# Patient Record
Sex: Male | Born: 2000
Health system: Southern US, Community
[De-identification: ages and names within clinical notes are randomized; demographics above are authoritative.]

---
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
## 2018-11-13 ENCOUNTER — Emergency Department
Admission: EM | Admit: 2018-11-13 | Discharge: 2018-11-13 | Disposition: A | Discharge status: Home / Self Care | Payer: BC Managed Care – PPO | Attending: Student | Admitting: Student

## 2018-11-13 ENCOUNTER — Emergency Department: Payer: BC Managed Care – PPO

## 2018-11-13 ENCOUNTER — Encounter: Payer: Self-pay | Admitting: Intensive Care

## 2018-11-13 ENCOUNTER — Other Ambulatory Visit: Payer: Self-pay

## 2018-11-13 DIAGNOSIS — R55 Syncope and collapse: Secondary | ICD-10-CM | POA: Insufficient documentation

## 2018-11-13 DIAGNOSIS — R000 Tachycardia, unspecified: Secondary | ICD-10-CM | POA: Insufficient documentation

## 2018-11-13 LAB — CBC
HCT: 51.6 % (ref 39.0–52.0)
Hemoglobin: 17.8 g/dL — ABNORMAL HIGH (ref 13.0–17.0)
MCH: 29.3 pg (ref 26.0–34.0)
MCHC: 34.5 g/dL (ref 30.0–36.0)
MCV: 84.9 fL (ref 80.0–100.0)
Platelets: 239 10*3/uL (ref 150–400)
RBC: 6.08 MIL/uL — ABNORMAL HIGH (ref 4.22–5.81)
RDW: 12.5 % (ref 11.5–15.5)
WBC: 7.4 10*3/uL (ref 4.0–10.5)
nRBC: 0 % (ref 0.0–0.2)

## 2018-11-13 LAB — BASIC METABOLIC PANEL
Anion gap: 10 (ref 5–15)
BUN: 15 mg/dL (ref 6–20)
CO2: 25 mmol/L (ref 22–32)
Calcium: 9.3 mg/dL (ref 8.9–10.3)
Chloride: 107 mmol/L (ref 98–111)
Creatinine, Ser: 1 mg/dL (ref 0.61–1.24)
GFR calc Af Amer: >60 mL/min (ref >60)
GFR calc non Af Amer: >60 mL/min (ref >60)
Glucose, Bld: 105 mg/dL — ABNORMAL HIGH (ref 70–99)
Potassium: 4.5 mmol/L (ref 3.5–5.1)
Sodium: 142 mmol/L (ref 135–145)

## 2018-11-13 LAB — URINALYSIS, COMPLETE (UACMP) WITH MICROSCOPIC
Bilirubin Urine: NEGATIVE
Glucose, UA: NEGATIVE mg/dL
Hgb urine dipstick: NEGATIVE
Ketones, ur: 20 mg/dL — AB
Leukocytes,Ua: NEGATIVE
Nitrite: NEGATIVE
Protein, ur: NEGATIVE mg/dL
Specific Gravity, Urine: 1.017 (ref 1.005–1.030)
Squamous Epithelial / HPF: NONE SEEN (ref 0–5)
pH: 5 (ref 5.0–8.0)

## 2018-11-13 LAB — FIBRIN DERIVATIVES D-DIMER (ARMC ONLY): Fibrin derivatives D-dimer (ARMC): 94.16 ng/mL (FEU) (ref 0.00–499.00)

## 2018-11-13 LAB — URINE DRUG SCREEN, QUALITATIVE (ARMC ONLY)
Amphetamines, Ur Screen: NOT DETECTED
Barbiturates, Ur Screen: NOT DETECTED
Benzodiazepine, Ur Scrn: NOT DETECTED
Cannabinoid 50 Ng, Ur ~~LOC~~: NOT DETECTED
Cocaine Metabolite,Ur ~~LOC~~: NOT DETECTED
MDMA (Ecstasy)Ur Screen: NOT DETECTED
Methadone Scn, Ur: NOT DETECTED
Opiate, Ur Screen: NOT DETECTED
Phencyclidine (PCP) Ur S: NOT DETECTED
Tricyclic, Ur Screen: NOT DETECTED

## 2018-11-13 LAB — TSH: TSH: 3.596 u[IU]/mL (ref 0.350–4.500)

## 2018-11-13 LAB — TROPONIN I (HIGH SENSITIVITY)
Troponin I (High Sensitivity): 5 ng/L (ref <18)
Troponin I (High Sensitivity): 5 ng/L (ref <18)

## 2018-11-13 MED ORDER — SODIUM CHLORIDE 0.9 % IV BOLUS
1000.0000 mL | Freq: Once | INTRAVENOUS | Status: AC
  Administered 2018-11-13: 1000 mL via INTRAVENOUS

## 2018-11-13 NOTE — ED Provider Notes (Signed)
[facility] Emergency Department Provider Note  ____________________________________________   First MD Initiated Contact with Patient 11/13/18 1409     (approximate)  I have reviewed the triage vital signs and the nursing notes.  History  Chief Complaint Tachycardia and Near Syncope    HPI Shawn Gill is a 18 y.o. male with no significant medical history who presents to the emergency department for tachycardia and an episode of near syncope.  Patient states he was at work earlier today, he bent over to pick something up and felt like his heart started racing.  This was associated with some lightheadedness.  Patient felt as though he might pass out, however he denies any true loss of consciousness.  He feels like his heart was racing for about 1-1.5 hours, and improved once he was in the triage area here.  He denies any associated chest pain, shortness of breath, nausea, diaphoresis.  He denies any personal history of arrhythmias.  No history of VTE.  He denies any recent illnesses, no fevers or chills.  No nausea, vomiting, diarrhea.  He denies any alcohol or drug use.   Past Medical Hx History reviewed. No pertinent past medical history.  Problem List There are no active problems to display for this patient.   Past Surgical Hx History reviewed. No pertinent surgical history.  Medications Prior to Admission medications   Not on File    Allergies Patient has no known allergies.  Family Hx History reviewed. No pertinent family history.  Social Hx Social History   Tobacco Use  . Smoking status: Never Smoker  . Smokeless tobacco: Never Used  Substance Use Topics  . Alcohol use: Never    Frequency: Never  . Drug use: Never     Review of Systems  Constitutional: Negative for fever, chills. Eyes: Negative for visual changes. ENT: Negative for sore throat. Cardiovascular: + tachycardia, palpitations Respiratory: Negative for shortness  of breath. Gastrointestinal: Negative for nausea, vomiting.  Genitourinary: Negative for dysuria. Musculoskeletal: Negative for leg swelling. Skin: Negative for rash. Neurological: Negative for for headaches. + lightheaded   Physical Exam  Vital Signs: ED Triage Vitals [11/13/18 1140]  Enc Vitals Group     BP (!) 156/114     Pulse Rate (!) 114     Resp 18     Temp 99 F (37.2 C)     Temp Source Oral     SpO2 99 %     Weight 240 lb (108.9 kg)     Height 5\' 9"  (1.753 m)     Head Circumference      Peak Flow      Pain Score 0     Pain Loc      Pain Edu?      Excl. in [place]?     Constitutional: Alert and oriented.  Head: Normocephalic. Atraumatic. Eyes: Conjunctivae clear. Sclera anicteric. Nose: No congestion. No rhinorrhea. Mouth/Throat: Mucous membranes are dry.  Neck: No stridor.   Cardiovascular: Tachycardic. Extremities well perfused. Respiratory: Normal respiratory effort.  Lungs CTAB. Gastrointestinal: Soft. Non-tender. Non-distended.  Musculoskeletal: No lower extremity edema. No deformities. Neurologic:  Normal speech and language. No gross focal neurologic deficits are appreciated.  Skin: Skin is warm, dry and intact. No rash noted. Psychiatric: Mood and affect are appropriate for situation.  EKG  Personally reviewed.   Rate: 124 Rhythm: sinus Axis: normal Intervals: WNL TWI in III Sinus tachycardia, no evidence of Brugada, WPW, or prolonged QTc No STEMI  Radiology  XR: IMPRESSION:  Negative chest.    Procedures  Procedure(s) performed (including critical care):  Procedures   Initial Impression / Assessment and Plan / ED Course  18 y.o. male who presents to the ED for tachycardia and near syncope.  Ddx: dehydration, electrolyte abnormality, thyroid disease, substance use, VTE, arrhythmia.  Plan for labs, urine, EKG, imaging.  EKG reveals sinus tachycardia, no acute ischemic changes, no evidence of arrhythmia.  Lab work-up reveals  normal TSH, troponin x2  negative.  D-dimer within normal limits.  UDS negative.  Urine with small amount of ketones, consistent with dehydration.  Patient has received 2 L IVF and reports feeling improved.  Repeat vitals demonstrate improvement in his HR.  As such, will plan for discharge with outpatient follow-up.  Given information for referral to establish wtih PCP as well as cardiology to discuss possible Holter monitor, though feel dehydration is largely contributing. Discussed return precautions.   Final Clinical Impression(s) / ED Diagnosis  Final diagnoses:  Tachycardia  Near syncope       Note:  This document was prepared using Dragon voice recognition software and may include unintentional dictation errors.   Miguel Aschoff., MD 11/13/18 [id number]

## 2018-11-13 NOTE — ED Triage Notes (Signed)
Patient reports he was at work and started to feel his heart racing and felt near syncope. Reports he felt his heart racing around 1 1/2 hours. Denies LOC. Patient denies pain at this time.

## 2018-11-13 NOTE — Discharge Instructions (Signed)
Thank you for letting us take care of you in the emergency department today.   Please continue to take any regular, prescribed medications. Please be sure to stay well hydrated.   Please follow up with: - Primary care doctor to review your ER visit and follow up on your symptoms. Information for one is below.   Please return to the ER for any new or worsening symptoms.

## 2019-08-31 ENCOUNTER — Ambulatory Visit: Admit: 2019-08-31 | Discharge status: Absent without leave | Payer: Self-pay

## 2019-08-31 ENCOUNTER — Other Ambulatory Visit: Payer: Self-pay

## 2019-08-31 ENCOUNTER — Ambulatory Visit
Admission: EM | Admit: 2019-08-31 | Discharge: 2019-08-31 | Disposition: A | Discharge status: Home / Self Care | Payer: BC Managed Care – PPO | Attending: Physician Assistant | Admitting: Physician Assistant

## 2019-08-31 ENCOUNTER — Encounter: Payer: Self-pay | Admitting: Emergency Medicine

## 2019-08-31 DIAGNOSIS — I10 Essential (primary) hypertension: Secondary | ICD-10-CM | POA: Diagnosis present

## 2019-08-31 DIAGNOSIS — H6122 Impacted cerumen, left ear: Secondary | ICD-10-CM

## 2019-08-31 DIAGNOSIS — R42 Dizziness and giddiness: Secondary | ICD-10-CM

## 2019-08-31 LAB — URINALYSIS, COMPLETE (UACMP) WITH MICROSCOPIC
Bacteria, UA: NONE SEEN
Bilirubin Urine: NEGATIVE
Glucose, UA: NEGATIVE mg/dL
Hgb urine dipstick: NEGATIVE
Ketones, ur: NEGATIVE mg/dL
Leukocytes,Ua: NEGATIVE
Nitrite: NEGATIVE
Protein, ur: NEGATIVE mg/dL
Specific Gravity, Urine: 1.025 (ref 1.005–1.030)
pH: 6.5 (ref 5.0–8.0)

## 2019-08-31 NOTE — ED Provider Notes (Signed)
[facility]    CSN: [id number] Arrival date & time: 08/31/19  1205      History   Chief Complaint Chief Complaint  Patient presents with  . Dizziness  . Nausea    HPI Shawn Gill is a 19 y.o. male.   19 year old male presents for concerns about feeling dizzy and nauseous over the past 2 to 3 days.  He says he usually feels a little dizzy and nauseous when getting up in the morning and gets little better throughout the day.  Dizziness increases certain movements of his head.  He denies dizziness upon standing.  He denies sensation of the room spinning.  He denies chest pain, headaches, blurred vision, weakness, fatigue, body aches, shortness of breath, palpitations, vomiting.  He has no history of cardiopulmonary disease or diabetes.  Patient says he is concerned about his blood pressure being high.  He has checked his blood pressure when he has felt dizzy and says the readings have been in the 140s over upper 90s.  No history of high blood pressure and he does not take any medications currently.  He and his parents are most concerned about his blood pressure and how that could be related to him feeling dizzy.  Denies recent illness or COVID exposure. States he is regularly tested for COVID and has been negative. No other concerns     History reviewed. No pertinent past medical history.  There are no problems to display for this patient.   History reviewed. No pertinent surgical history.     Home Medications    Prior to Admission medications   Not on File    Family History History reviewed. No pertinent family history.  Social History Social History   Tobacco Use  . Smoking status: Never Smoker  . Smokeless tobacco: Never Used  Vaping Use  . Vaping Use: Never used  Substance Use Topics  . Alcohol use: Never  . Drug use: Never     Allergies   Patient has no known allergies.   Review of Systems Review of Systems  Constitutional: Negative  for diaphoresis, fatigue and fever.  HENT: Negative for congestion, ear pain, rhinorrhea, sinus pain and sore throat.   Respiratory: Negative for cough, chest tightness, shortness of breath and wheezing.   Cardiovascular: Negative for chest pain and palpitations.  Gastrointestinal: Negative for abdominal pain, nausea and vomiting.  Musculoskeletal: Negative for myalgias.  Neurological: Positive for dizziness and light-headedness. Negative for weakness, numbness and headaches.  Psychiatric/Behavioral: The patient is not nervous/anxious.      Physical Exam Triage Vital Signs ED Triage Vitals  Enc Vitals Group     BP 08/31/19 1247 (!) 145/102     Pulse Rate 08/31/19 1247 78     Resp 08/31/19 1247 16     Temp 08/31/19 1247 99 F (37.2 C)     Temp Source 08/31/19 1247 Oral     SpO2 08/31/19 1247 100 %     Weight 08/31/19 1245 243 lb (110.2 kg)     Height 08/31/19 1245 5\' 10"  (1.778 m)     Head Circumference --      Peak Flow --      Pain Score 08/31/19 1244 0     Pain Loc --      Pain Edu? --      Excl. in GC? --    No data found.  Updated Vital Signs BP (!) 146/92 (BP Location: Left Arm)   Pulse 78  Temp 99 F (37.2 C) (Oral)   Resp 16   Ht 5\' 10"  (1.778 m)   Wt 243 lb (110.2 kg)   SpO2 100%   BMI 34.87 kg/m       Physical Exam Vitals and nursing note reviewed.  Constitutional:      General: He is not in acute distress.    Appearance: Normal appearance. He is well-developed. He is obese. He is not ill-appearing or toxic-appearing.  HENT:     Head: Normocephalic and atraumatic.     Right Ear: Hearing normal. Tympanic membrane is injected.     Left Ear: Hearing normal. There is impacted cerumen. Tympanic membrane is injected.     Ears:     Comments: There is mild to moderate cerumen of the right EAC, not obstructing view of the TM.  After lavage, both TMs are mildly injected.    Nose: Nose normal. No rhinorrhea.     Mouth/Throat:     Mouth: Mucous membranes are  moist.     Pharynx: Oropharynx is clear.  Eyes:     General: No scleral icterus.    Extraocular Movements: Extraocular movements intact.     Conjunctiva/sclera: Conjunctivae normal.     Pupils: Pupils are equal, round, and reactive to light.  Cardiovascular:     Rate and Rhythm: Normal rate and regular rhythm.     Heart sounds: Normal heart sounds. No murmur heard.   Pulmonary:     Effort: Pulmonary effort is normal. No respiratory distress.     Breath sounds: Normal breath sounds.  Musculoskeletal:     Cervical back: Normal range of motion and neck supple.     Right lower leg: No edema.     Left lower leg: No edema.  Skin:    General: Skin is warm and dry.     Findings: No rash.  Neurological:     General: No focal deficit present.     Mental Status: He is alert and oriented to person, place, and time. Mental status is at baseline.     Motor: No weakness.     Coordination: Coordination normal.     Gait: Gait normal.  Psychiatric:        Mood and Affect: Mood normal.        Behavior: Behavior normal.        Thought Content: Thought content normal.      UC Treatments / Results  Labs (all labs ordered are listed, but only abnormal results are displayed) Labs Reviewed  URINALYSIS, COMPLETE (UACMP) WITH MICROSCOPIC    EKG   Radiology No results found.  Procedures Procedures (including critical care time)  Medications Ordered in UC Medications - No data to display  Initial Impression / Assessment and Plan / UC Course  I have reviewed the triage vital signs and the nursing notes.  Pertinent labs & imaging results that were available during my care of the patient were reviewed by me and considered in my medical decision making (see chart for details).  Clinical Course as of Aug 31 1439  Fri Aug 31, 2019  1340 BP(!): 145/102 [LE]    Clinical Course User Index [LE] Sep 02, 2019     19 year old male presenting for complaints of feeling dizzy,  lightheaded, nauseous intermittently.  Symptoms worsen with movement of his head.  On exam I noted cerumen impacted in the left EAC and moderate cerumen in the right EAC.  Lavage performed and he stated he was feeling  a little better.  Blood pressure elevated at 145/102.  Repeat blood pressure at 146/92.  Denies chest pain, shortness of breath, sweats, passing out.  UA performed today and normal.  Advised patient that his dizziness and lightheadedness is most likely related to the cerumen impaction and caused by issue with his equilibrium due to that.  Blood pressure is only mildly elevated and I do not suspect that would cause his dizziness.  I still advise him to follow-up with primary care and provided him with the contact information for primary care.  Advised him to follow-up with the ED if any symptoms worsen or he is any new symptoms such as chest pain, shortness of breath, weakness, passing out.   Final Clinical Impressions(s) / UC Diagnoses   Final diagnoses:  Dizziness  Elevated blood pressure reading in office with diagnosis of hypertension  Impacted cerumen of left ear     Discharge Instructions     Your blood pressure is mildly elevated in the clinic today. A blood pressure recheck was also mildly elevated today. On exam you had impacted earwax in the left ear canal. That was flushed out today. I believe your dizziness symptoms are probably related to the earwax. Hopefully, your dizziness resolves now the ear wax is cleaned out. However your blood pressure was little elevated so you should follow-up with primary care to have a work-up. At this time keep a log of your blood pressure daily. Also try to increase exercise, reduce stress, decrease calories and lose weight if overweight. You should seek immediate reexamination if you have chest pain, shortness of breath, severe headache, vision changes, or weakness.  [facility] at [facility], [address], [address].  Phone number: [phone] Please call this number to establish with primary care     ED Prescriptions    None     PDMP not reviewed this encounter.   Shirlee Latch, PA-C 08/31/19 1441

## 2019-08-31 NOTE — ED Triage Notes (Signed)
Patient c/o dizziness and nausea for the past 2-3 days.  Patient denies V/D.  Patient denies fevers.

## 2019-08-31 NOTE — Discharge Instructions (Signed)
Your blood pressure is mildly elevated in the clinic today. A blood pressure recheck was also mildly elevated today. On exam you had impacted earwax in the left ear canal. That was flushed out today. I believe your dizziness symptoms are probably related to the earwax. Hopefully, your dizziness resolves now the ear wax is cleaned out. However your blood pressure was little elevated so you should follow-up with primary care to have a work-up. At this time keep a log of your blood pressure daily. Also try to increase exercise, reduce stress, decrease calories and lose weight if overweight. You should seek immediate reexamination if you have chest pain, shortness of breath, severe headache, vision changes, or weakness.  [facility] at [facility], [address], [address]. Phone number: [phone] Please call this number to establish with primary care

## 2019-10-01 ENCOUNTER — Ambulatory Visit
Admission: EM | Admit: 2019-10-01 | Discharge: 2019-10-01 | Disposition: A | Discharge status: Other Acute Inpatient Hospital | Payer: BC Managed Care – PPO | Attending: Physician Assistant | Admitting: Physician Assistant

## 2019-10-01 ENCOUNTER — Emergency Department
Admission: EM | Admit: 2019-10-01 | Discharge: 2019-10-01 | Disposition: A | Discharge status: Home / Self Care | Payer: BC Managed Care – PPO | Attending: Emergency Medicine | Admitting: Emergency Medicine

## 2019-10-01 ENCOUNTER — Other Ambulatory Visit: Payer: Self-pay

## 2019-10-01 ENCOUNTER — Encounter: Payer: Self-pay | Admitting: Emergency Medicine

## 2019-10-01 DIAGNOSIS — I471 Supraventricular tachycardia, unspecified: Secondary | ICD-10-CM

## 2019-10-01 DIAGNOSIS — R079 Chest pain, unspecified: Secondary | ICD-10-CM | POA: Diagnosis not present

## 2019-10-01 DIAGNOSIS — R002 Palpitations: Secondary | ICD-10-CM | POA: Diagnosis present

## 2019-10-01 MED ORDER — ADENOSINE 12 MG/4ML IV SOLN: 12.0000 mg | Freq: Once | INTRAVENOUS | Status: AC

## 2019-10-01 MED ORDER — DILTIAZEM HCL 25 MG/5ML IV SOLN
10.0000 mg | Freq: Once | INTRAVENOUS | Status: AC
  Administered 2019-10-01: 10 mg via INTRAVENOUS

## 2019-10-01 MED ORDER — ADENOSINE 12 MG/4ML IV SOLN
INTRAVENOUS | Status: AC
  Administered 2019-10-01: 12 mg via INTRAVENOUS
  Filled 2019-10-01: qty 4

## 2019-10-01 MED ORDER — SODIUM CHLORIDE 0.9 % IV BOLUS
1000.0000 mL | Freq: Once | INTRAVENOUS | Status: AC
  Administered 2019-10-01: 1000 mL via INTRAVENOUS

## 2019-10-01 NOTE — ED Notes (Signed)
Patient is being discharged from the Urgent Care and sent to the Emergency Department via EMS . Per Eusebio Friendly, PA, patient is in need of higher level of care due to SVT. Patient is aware and verbalizes understanding of plan of care.  Vitals:   10/01/19 1732 10/01/19 1751  BP: 111/82 (!) 111/50  Pulse: (!) 224 (!) 111  Resp: (!) 22   SpO2: 98% 98%

## 2019-10-01 NOTE — ED Provider Notes (Signed)
[facility]    CSN: [id number] Arrival date & time: 10/01/19  1644      History   Chief Complaint Chief Complaint  Patient presents with  . Palpitations    HPI Shawn Gill is a 19 y.o. male.   Patient presents for feeling dizzy and having palpitations for the past hour.  He says symptoms are progressively worsening.  Admits to some numbness and tingling in his hands.  He says he has felt dizzy and like his heart was fluttering occasionally at nighttime, but never anything like this.  Patient denies any history of cardiopulmonary disease.  He currently admits to some chest pain and feeling tired.  He denies any fevers, recent illness, cough.  He admits to some mild shortness of breath.  Denies headaches.  Denies vision changes.  Denies syncope.  Has not received Covid vaccine.  Denies Covid exposure.  Patient denies taking any new medications and says he does not take any routine medications.  He denies smoking, alcohol use, or drug use.  He says he was just at work when this came on and nothing seemed to start it.  Patient was seen in the clinic a month ago for dizziness, but denied any palpitations or chest pain at that time.  His dizziness at that time was worsened with moving his head.  Otic lavage performed at that time which relieved his dizziness.  He says that his current symptoms are not similar to what he was experiencing last month.  There are no other complaints or concerns.  HPI  History reviewed. No pertinent past medical history.  There are no problems to display for this patient.   History reviewed. No pertinent surgical history.     Home Medications    Prior to Admission medications   Not on File    Family History Family History  Problem Relation Age of Onset  . Healthy Mother   . Healthy Father     Social History Social History   Tobacco Use  . Smoking status: Never Smoker  . Smokeless tobacco: Never Used  Vaping Use  . Vaping Use:  Never used  Substance Use Topics  . Alcohol use: Never  . Drug use: Never     Allergies   Patient has no known allergies.   Review of Systems Review of Systems  Constitutional: Negative for fatigue and fever.  HENT: Negative for congestion.   Eyes: Negative for visual disturbance.  Respiratory: Positive for shortness of breath. Negative for cough and wheezing.   Cardiovascular: Positive for chest pain and palpitations.  Gastrointestinal: Negative for abdominal pain, nausea and vomiting.  Musculoskeletal: Negative for myalgias.  Neurological: Positive for dizziness, light-headedness and numbness. Negative for syncope, weakness and headaches.     Physical Exam Triage Vital Signs ED Triage Vitals  Enc Vitals Group     BP 10/01/19 1732 111/82     Pulse Rate 10/01/19 1732 (!) 224     Resp 10/01/19 1732 (!) 22     Temp --      Temp src --      SpO2 10/01/19 1732 98 %     Weight --      Height --      Head Circumference --      Peak Flow --      Pain Score 10/01/19 1800 4     Pain Loc --      Pain Edu? --      Excl. in GC? --  No data found.  Updated Vital Signs BP (!) 111/50 (BP Location: Right Arm)   Pulse (!) 111   Resp (!) 22   SpO2 98%       Physical Exam Vitals and nursing note reviewed.  Constitutional:      General: He is in acute distress.     Appearance: Normal appearance. He is well-developed. He is ill-appearing and diaphoretic. He is not toxic-appearing.     Comments: Patient is pale with accelerated respiratory rate and heart rate.  He is closing his eyes and saying that he is dizzy and feels weak.  HENT:     Head: Normocephalic and atraumatic.  Eyes:     General: No scleral icterus.    Conjunctiva/sclera: Conjunctivae normal.     Pupils: Pupils are equal, round, and reactive to light.  Cardiovascular:     Rate and Rhythm: Regular rhythm. Tachycardia present.     Pulses: Normal pulses.     Heart sounds: No murmur heard.      Comments:  Good peripheral pulses Pulmonary:     Effort: Pulmonary effort is normal. No respiratory distress.     Breath sounds: Normal breath sounds.  Musculoskeletal:     Cervical back: Neck supple.  Skin:    General: Skin is warm.  Neurological:     General: No focal deficit present.     Mental Status: He is alert. Mental status is at baseline.     Motor: No weakness.     Gait: Gait normal.  Psychiatric:        Mood and Affect: Mood normal.        Behavior: Behavior normal.        Thought Content: Thought content normal.      UC Treatments / Results  Labs (all labs ordered are listed, but only abnormal results are displayed) Labs Reviewed - No data to display  EKG   Radiology No results found.  Procedures Procedures (including critical care time)  Medications Ordered in UC Medications - No data to display  Initial Impression / Assessment and Plan / UC Course  I have reviewed the triage vital signs and the nursing notes.  Pertinent labs & imaging results that were available during my care of the patient were reviewed by me and considered in my medical decision making (see chart for details).   EKG obtained today shows heart rate 220 and SVT.  Valsalva attempted with blowing into the syringe, holding breath and bearing down, carotid massage.  All attempts failed.  EMS was called immediately after EKG was obtained at 1730.  Nursing staff placed IV to right AC. Paramedic arrived and patient given 6 mg adenosine IV at 1746 and HR did not respond. 12 mg IV given at 1748 and HR decreased from 230s to 110s. Patient reported feeling better and his coloration returned to normal. Within 45 seconds, his HR increased back to 230s and he reported increase in palpitations and feeling weak. Patient loaded to stretcher and taken to ambulance where EMS stated they planned to attempt another push of adenosine possibly on the way to [facility].   Final Clinical Impressions(s) / UC Diagnoses   Final  diagnoses:  SVT (supraventricular tachycardia) (HCC)     Discharge Instructions     You have been advised to follow up immediately in the emergency department for concerning signs.symptoms. If you declined EMS transport, please have a family member take you directly to the ED at this time. Do not delay.  Based on concerns about condition, if you do not follow up in th e ED, you may risk poor outcomes including worsening of condition, delayed treatment and potentially life threatening issues. If you have declined to go to the ED at this time, you should call your PCP immediately to set up a follow up appointment.  Go to ED for red flag symptoms, including; fevers you cannot reduce with Tylenol/Motrin, severe headaches, vision changes, numbness/weakness in part of the body, lethargy, confusion, intractable vomiting, severe dehydration, chest pain, breathing difficulty, severe persistent abdominal or pelvic pain, signs of severe infection (increased redness, swelling of an area), feeling faint or passing out, dizziness, etc. You should especially go to the ED for sudden acute worsening of condition if you do not elect to go at this time.     ED Prescriptions    None     PDMP not reviewed this encounter.   Shirlee Latch, PA-C 10/02/19 [phone]

## 2019-10-01 NOTE — ED Triage Notes (Signed)
Patient states he feels his heart is beating fast. He states this started about 1 hour. He had episodes of dizziness.

## 2019-10-01 NOTE — ED Notes (Signed)
Pt requesting update. MD Bradler at bedside

## 2019-10-01 NOTE — ED Triage Notes (Signed)
Pt to ED from [facility]. Per EMS pt was having CP and wasn't feeling well. Urgent care EKG showed SVT with HR in 280s. Pt given 6mg , 12mg , and 12mg  of adenosine in route. EMS stating after first 12mg  pt HR and symptoms decreased but only lasted temporarily. Pt stating he has been told he had a flutter HR but has not seen a cardiologist yet.   Upon arrival pt HR showing SVT in 220s. Diltiazem given. Pt placed on pads. MD called.

## 2019-10-01 NOTE — ED Notes (Signed)
Pt in sinus tach rhythm at this time. Pt stating relief at this time

## 2019-10-01 NOTE — ED Notes (Signed)
Pt alert and oriented X 4, stable for discharge. RR even and unlabored, color WNL. Discussed discharge instructions and follow-up as directed. Discharge medications discussed if provided. Pt had opportunity to ask questions if necessary and RN to provide patient/family eduction.  

## 2019-10-01 NOTE — ED Provider Notes (Signed)
[facility] Emergency Department Provider Note   ____________________________________________   First MD Initiated Contact with Patient 10/01/19 1841     (approximate)  I have reviewed the triage vital signs and the nursing notes.   HISTORY  Chief Complaint No chief complaint on file.    HPI Shawn Gill is a 19 y.o. male with a stated past medical history of SVT who presents for palpitations and chest pain that began approximately 1 hour prior to arrival.  EMS state the patient was found in SVT prior to arrival and received 6 mg of adenosine, then 12 mg of adenosine, then another 12 mg of adenosine with slowing of patient's heart rate into the 120s for at the longest approximately 30 seconds before returning to SVT.  Patient arrives in no acute distress complaining of palpitations and some chest pain.  Patient denies any exacerbating or relieving factors for the symptoms.         No past medical history on file.  There are no problems to display for this patient.   No past surgical history on file.  Prior to Admission medications   Not on File    Allergies Patient has no known allergies.  Family History  Problem Relation Age of Onset  . Healthy Mother   . Healthy Father     Social History Social History   Tobacco Use  . Smoking status: Never Smoker  . Smokeless tobacco: Never Used  Vaping Use  . Vaping Use: Never used  Substance Use Topics  . Alcohol use: Never  . Drug use: Never    Review of Systems Constitutional: No fever/chills Eyes: No visual changes. ENT: No sore throat. Cardiovascular: Endorses chest pain. Respiratory: Denies shortness of breath. Gastrointestinal: No abdominal pain.  No nausea, no vomiting.  No diarrhea. Genitourinary: Negative for dysuria. Musculoskeletal: Negative for acute arthralgias Skin: Negative for rash. Neurological: Negative for headaches, weakness/numbness/paresthesias in any  extremity Psychiatric: Negative for suicidal ideation/homicidal ideation   ____________________________________________   PHYSICAL EXAM:  VITAL SIGNS: ED Triage Vitals  Enc Vitals Group     BP 10/01/19 1830 (!) 135/92     Pulse Rate 10/01/19 1830 (!) 220     Resp 10/01/19 1830 (!) 24     Temp 10/01/19 2159 98.5 F (36.9 C)     Temp Source 10/01/19 2159 Oral     SpO2 10/01/19 1830 98 %     Weight --      Height --      Head Circumference --      Peak Flow --      Pain Score 10/01/19 1929 0     Pain Loc --      Pain Edu? --      Excl. in GC? --    Constitutional: Alert and oriented. Well appearing and in no acute distress. Eyes: Conjunctivae are normal. PERRL. Head: Atraumatic. Nose: No congestion/rhinnorhea. Mouth/Throat: Mucous membranes are moist. Neck: No stridor Cardiovascular: Tachycardic regular rhythm.  Grossly normal heart sounds.  Good peripheral circulation. Respiratory: Normal respiratory effort.  No retractions. Gastrointestinal: Soft and nontender. No distention. Musculoskeletal: No obvious deformities Neurologic:  Normal speech and language. No gross focal neurologic deficits are appreciated. Skin:  Skin is warm and dry. No rash noted. Psychiatric: Mood and affect are normal. Speech and behavior are normal.  ____________________________________________   LABS (all labs ordered are listed, but only abnormal results are displayed)  Labs Reviewed - No data to display ____________________________________________  EKG  ED ECG REPORT I, Merwyn Katos, the attending physician, personally viewed and interpreted this ECG.  Date: 10/01/2019 EKG Time: 1827 Rate: 210 Rhythm: SVT QRS Axis: normal Intervals: normal ST/T Wave abnormalities: normal Narrative Interpretation: no evidence of acute ischemia   PROCEDURES  Procedure(s) performed (including Critical Care):  .Critical Care Performed by: Merwyn Katos, MD Authorized by: Merwyn Katos,  MD   Critical care provider statement:    Critical care time (minutes):  37   Critical care time was exclusive of:  Separately billable procedures and treating other patients   Critical care was necessary to treat or prevent imminent or life-threatening deterioration of the following conditions:  Cardiac failure   Critical care was time spent personally by me on the following activities:  Discussions with consultants, evaluation of patient's response to treatment, examination of patient, ordering and performing treatments and interventions, ordering and review of laboratory studies, ordering and review of radiographic studies, pulse oximetry, re-evaluation of patient's condition, obtaining history from patient or surrogate and review of old charts   I assumed direction of critical care for this patient from another provider in my specialty: no   .1-3 Lead EKG Interpretation Performed by: Merwyn Katos, MD Authorized by: Merwyn Katos, MD     Interpretation: abnormal     ECG rate:  220   ECG rate assessment: tachycardic     Rhythm: SVT     Ectopy: none     Conduction: normal       ____________________________________________   INITIAL IMPRESSION / ASSESSMENT AND PLAN / ED COURSE  As part of my medical decision making, I reviewed the following data within the electronic MEDICAL RECORD NUMBER Nursing notes reviewed and incorporated, Labs reviewed, EKG interpreted, Old chart reviewed, Radiograph reviewed and Notes from prior ED visits reviewed and incorporated        Presents with palpitations and ECG is noted to be indicative of supraventricular tachycardia. Palpitations unlikely secondary to other concomitant cause such as pulmonary embolus or ACS.  Interventions: Patient did not respond to vagal maneuvers. The patients vital signs and clinical condition warranted the use chemical cardioversion, and given their rhythm adenosine was chosen. A time out was undertaken to assure that  this was the correct patient and the correct procedure for this patient. Adenosine 12 mg was given in the standard rapid IV push fashion. The result of this chemical cardioversion was a return to a normal sinus rhythm. The patient tolerated this procedure very well, there were no complications.  Post Conversion ECG: Without overt e/o STEMI, Brugadas sign, delta wave, epsilon wave, significantly prolonged QTc, or malignant arrhythmia.  Disposition: Counseled patient to avoid stimulants. This is not patients first SVT. Counseled patient to follow up within 24-48 hours with their cardiologist and primary care doctors. Discharge home with SRP.      ____________________________________________   FINAL CLINICAL IMPRESSION(S) / ED DIAGNOSES  Final diagnoses:  SVT (supraventricular tachycardia) (HCC)  Chest pain, unspecified type     ED Discharge Orders    None       Note:  This document was prepared using Dragon voice recognition software and may include unintentional dictation errors.   Merwyn Katos, MD 10/01/19 [phone]

## 2019-10-01 NOTE — ED Notes (Signed)
1746 Adenosine 6mg  given by EMS, heart rate 224  1748 Adenosine 12 mg given by EMS, heart rate 115

## 2019-10-02 NOTE — Discharge Instructions (Addendum)

## 2021-08-02 IMAGING — CR DG CHEST 2V
1 series · 2 of 2 positions shown · non-contrast
Comparison: None.

CLINICAL DATA: Chest pressure and sensation of tachycardia today.

EXAM:
CHEST - 2 VIEW

[Series 1: dg chest 2 view · 0.14mm/px · 2 of 2 slices shown]
[im 1/2]
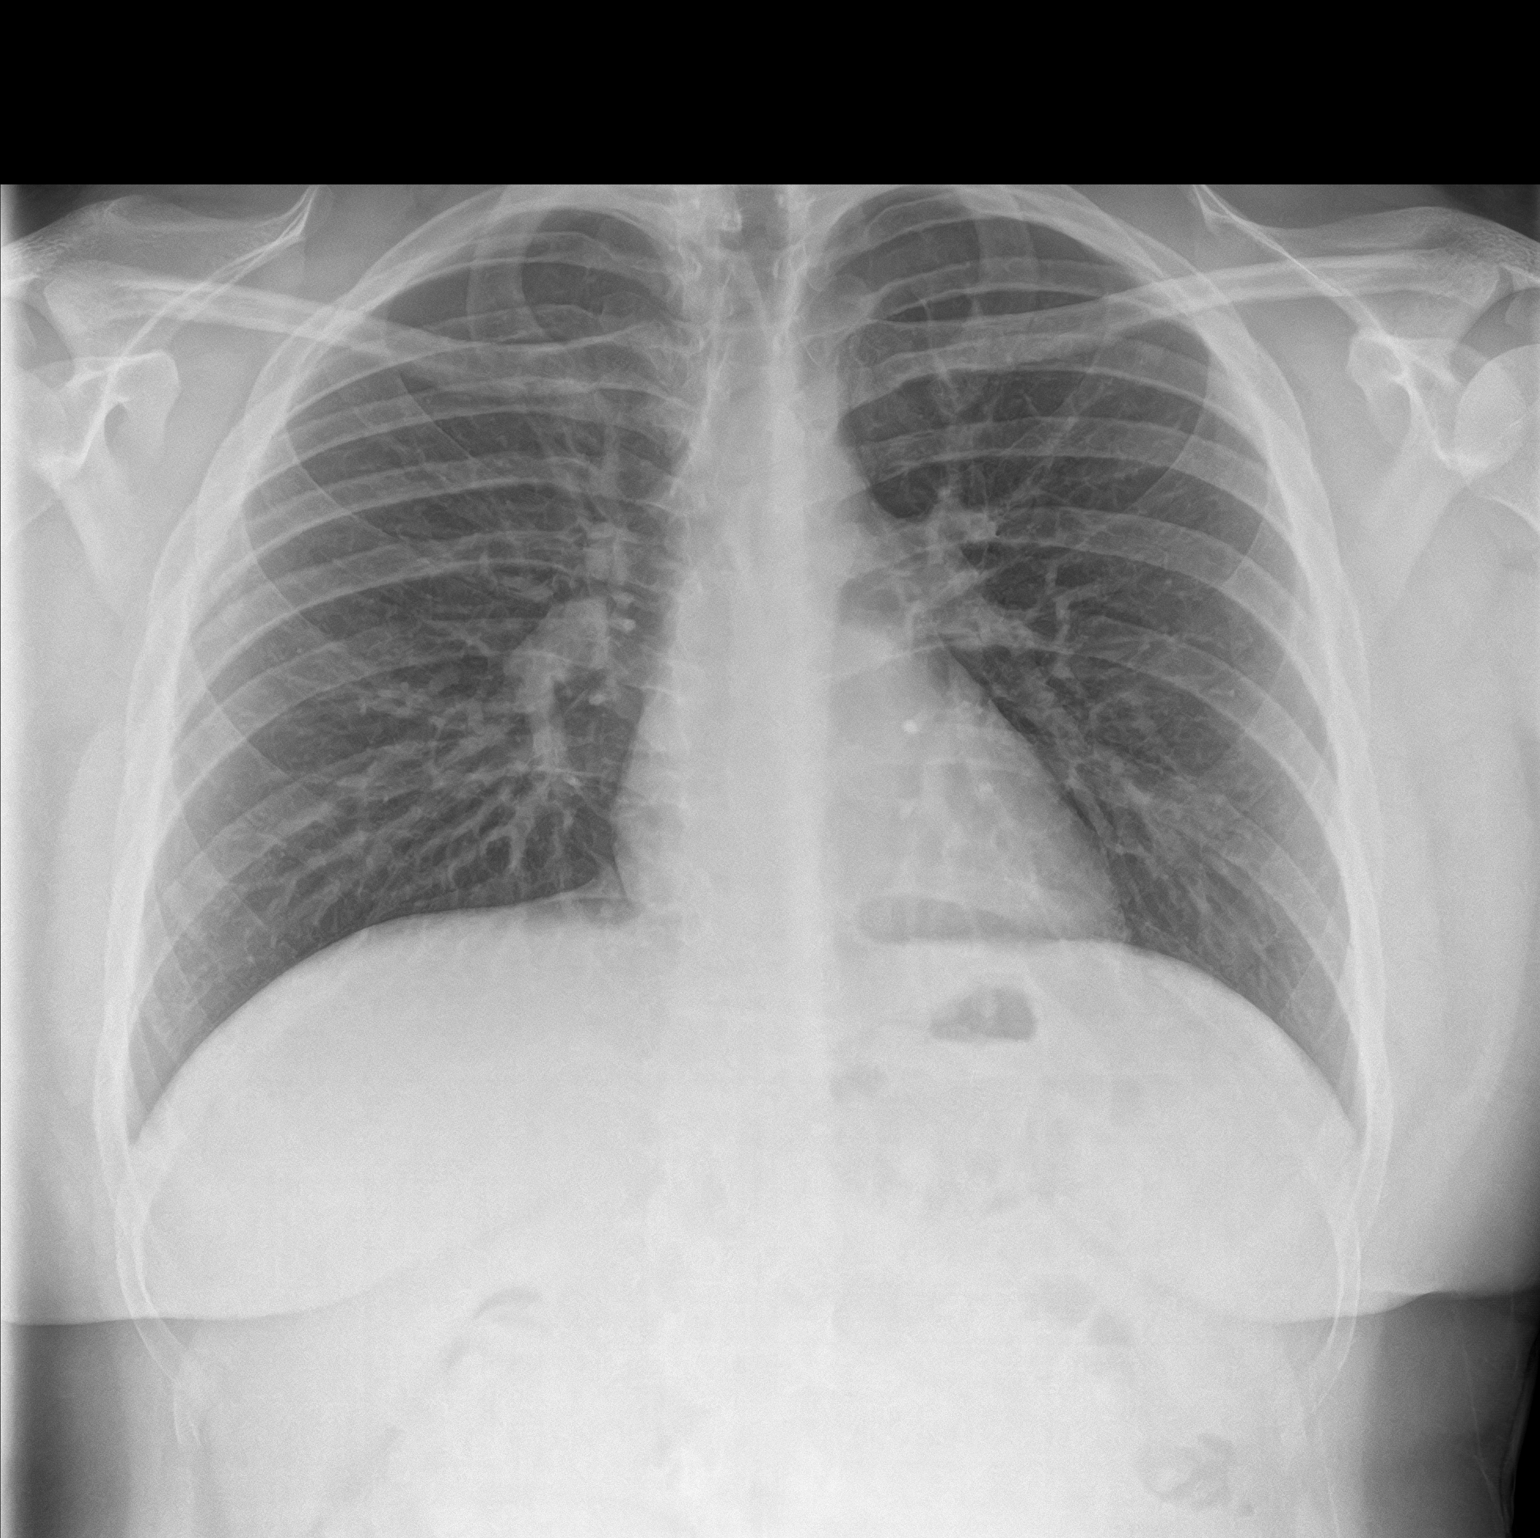
[im 2/2]
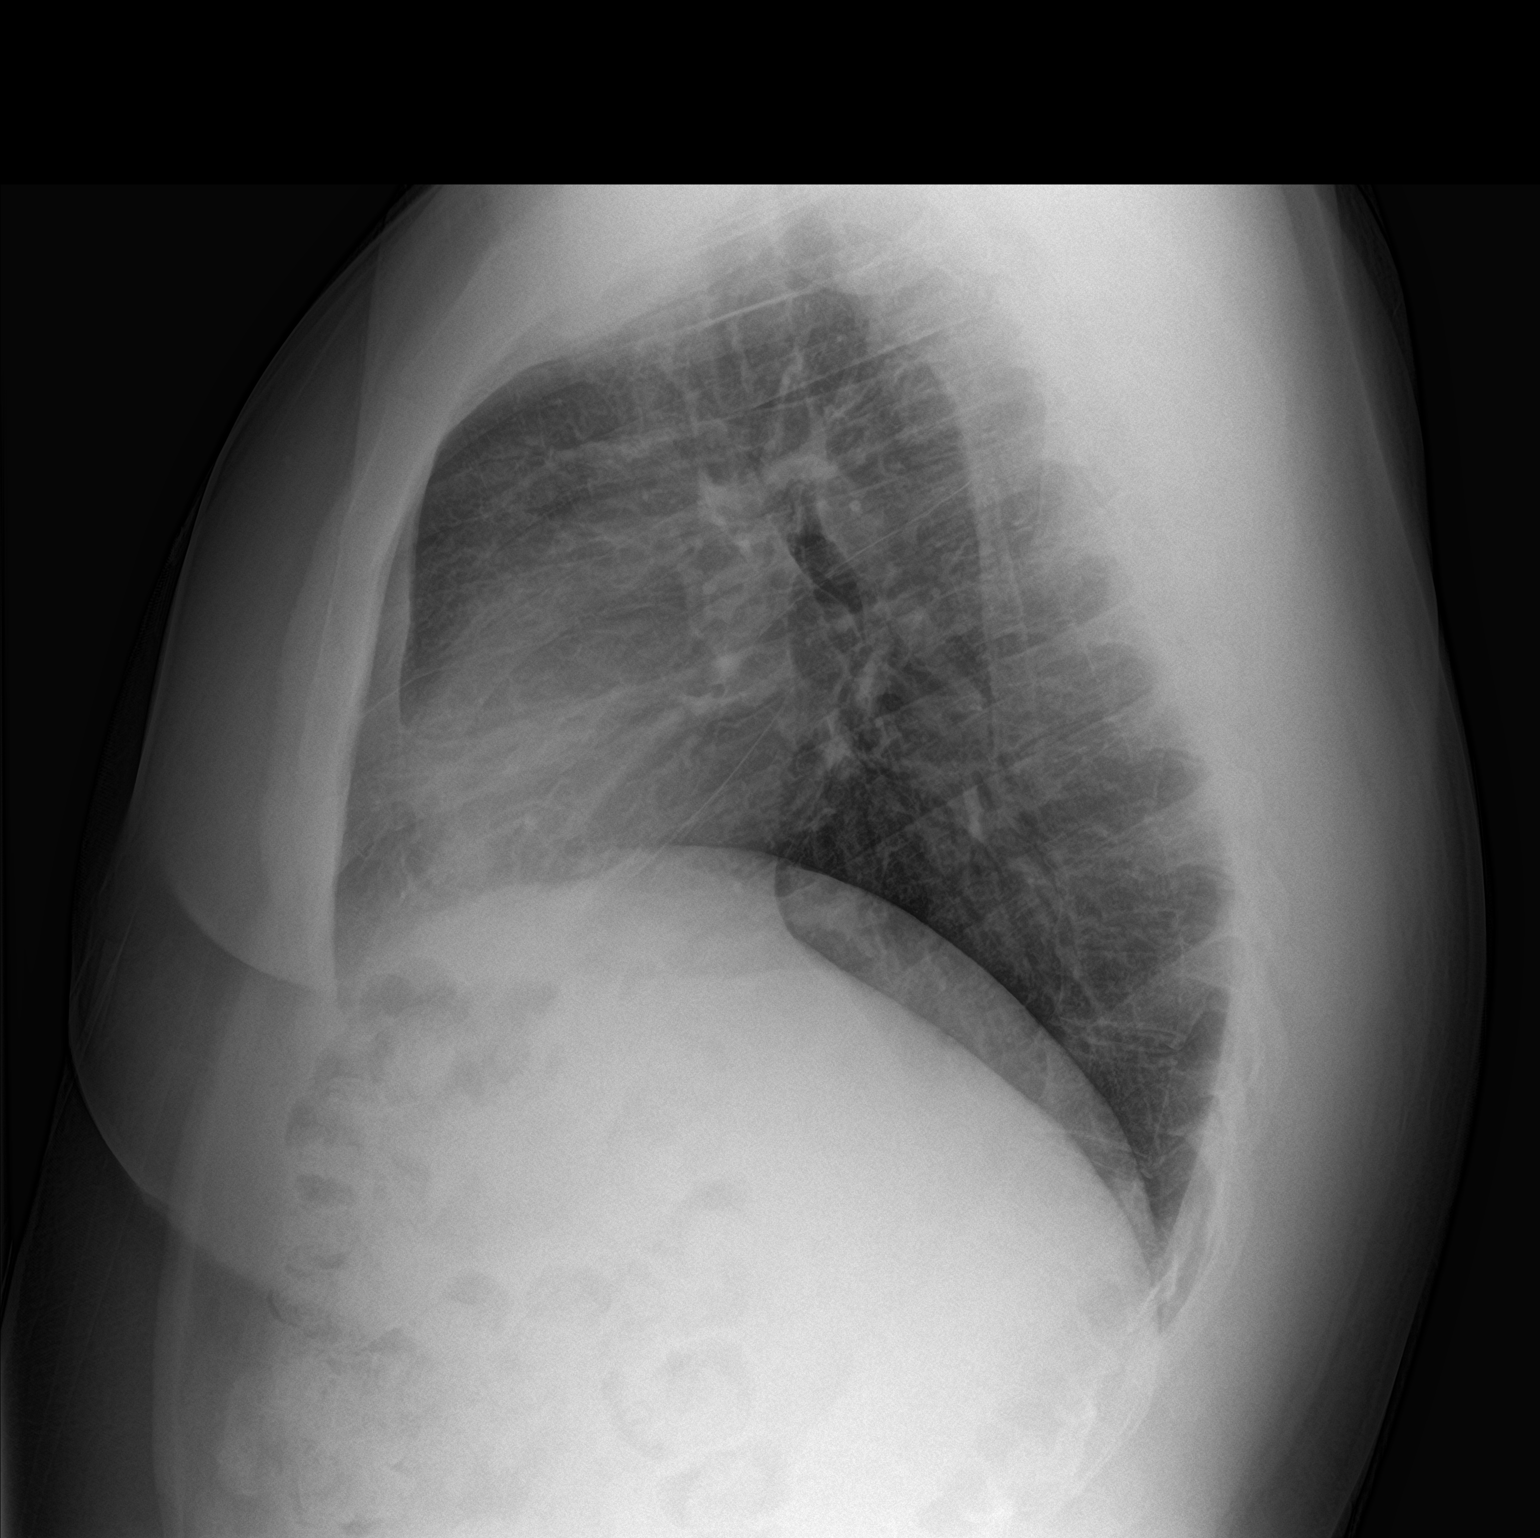

[2 of 2 positions shown; findings below may reference images not displayed]

FINDINGS: Lungs are clear. Heart size is normal. No pneumothorax or pleural
fluid. No acute or focal bony abnormality.
IMPRESSION: Negative chest.
